# Patient Record
Sex: Female | Born: 2000 | Race: Black or African American | Hispanic: No | Marital: Single | Smoking: Never smoker
Health system: Southern US, Community
[De-identification: ages and names within clinical notes are randomized; demographics above are authoritative.]

## PROBLEM LIST (undated history)

## (undated) DIAGNOSIS — F909 Attention-deficit hyperactivity disorder, unspecified type: Secondary | ICD-10-CM

## (undated) DIAGNOSIS — R062 Wheezing: Secondary | ICD-10-CM

## (undated) DIAGNOSIS — F319 Bipolar disorder, unspecified: Secondary | ICD-10-CM

---
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
## 2021-03-25 IMAGING — DX RIGHT WRIST - COMPLETE 3+ VIEW
4 series · 4 of 4 positions shown · non-contrast
Comparison: Plain films right wrist 03/07/2018.

CLINICAL DATA: Right wrist pain and swelling for 1-1/2 months. No
known injury.

EXAM:
RIGHT WRIST - COMPLETE 3+ VIEW

[wrist pa]
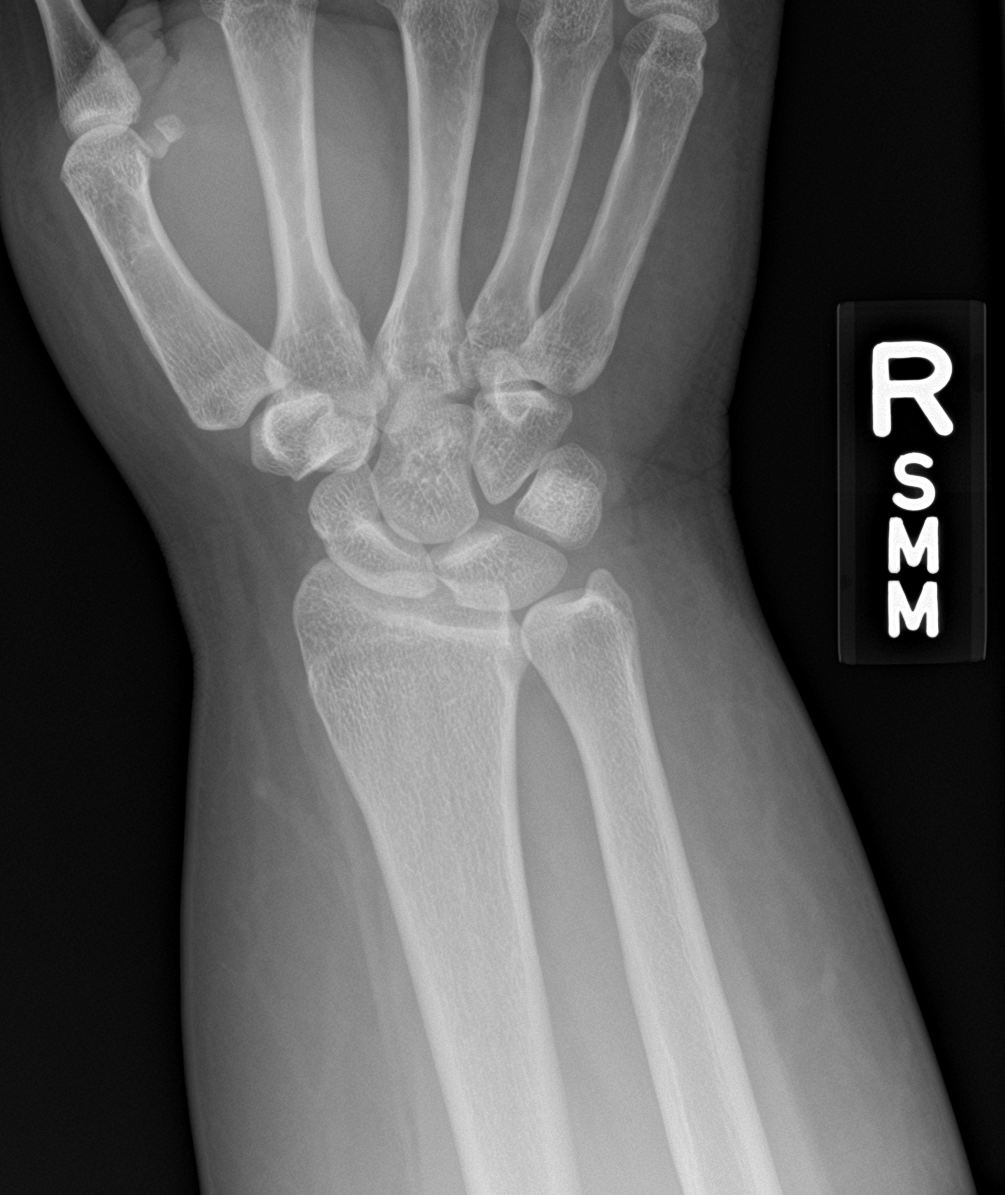

[wrist navicular]
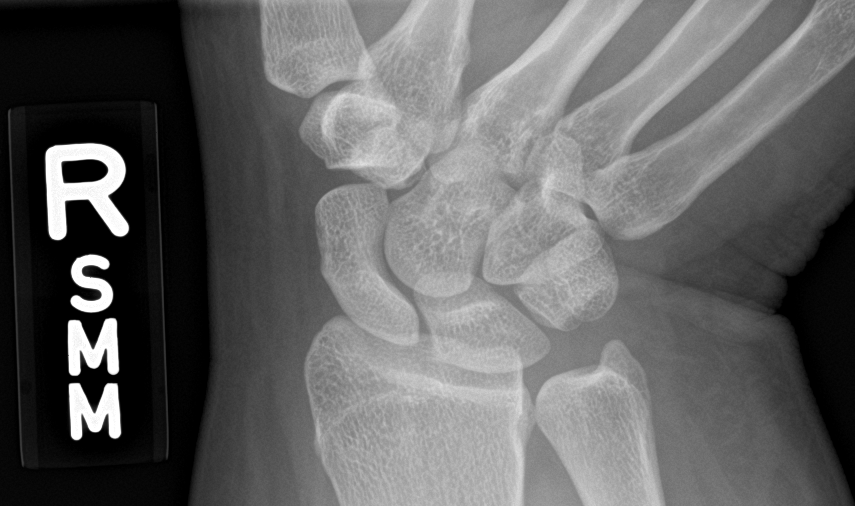

[wrist obl]
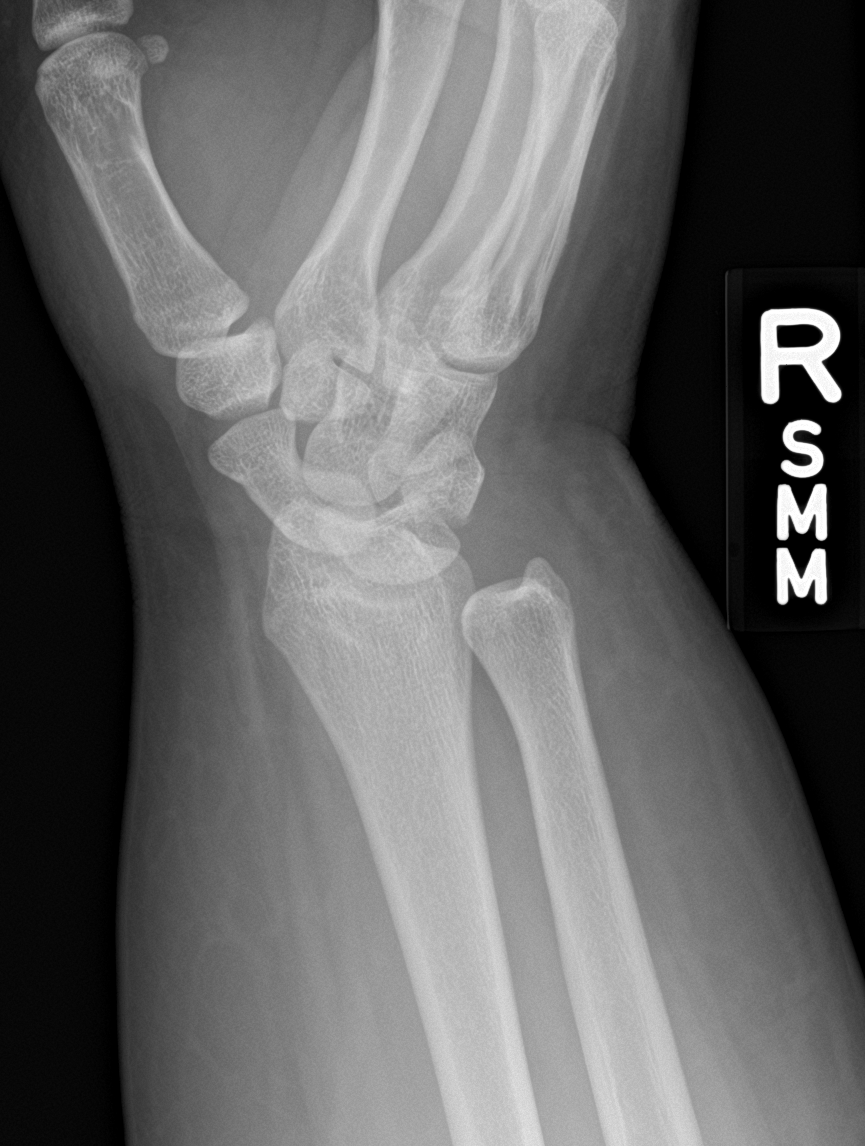

[wrist lat]
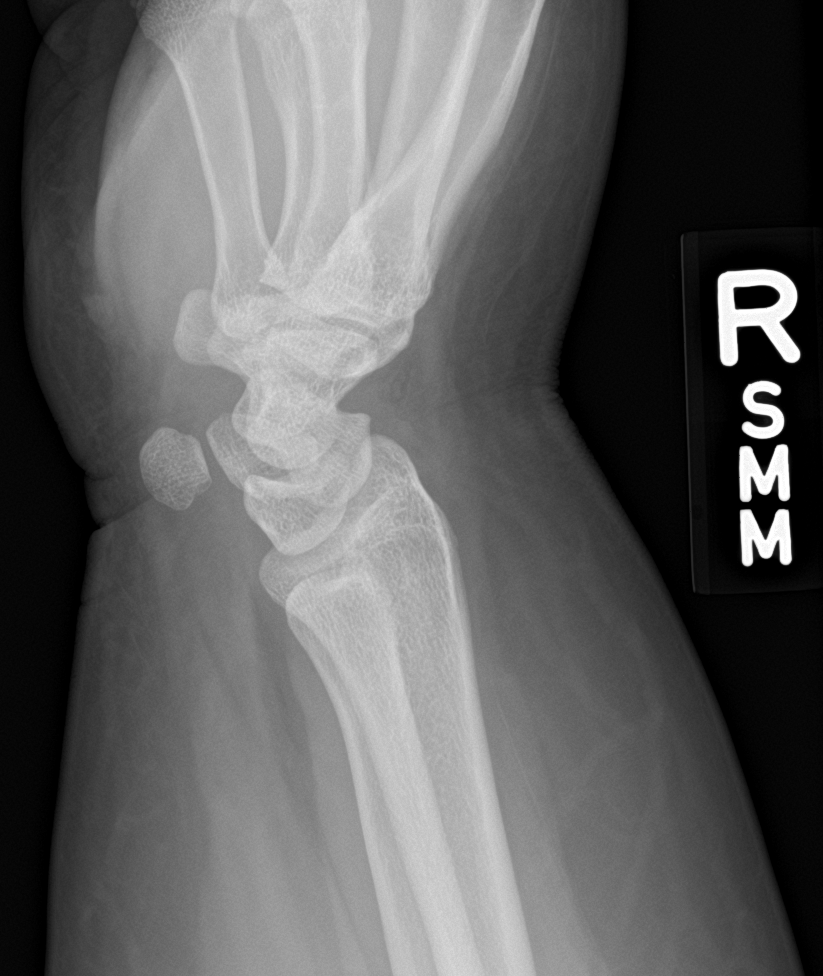

[4 of 4 positions shown; findings below may reference images not displayed]

FINDINGS: There is no evidence of fracture or dislocation. There is no
evidence of arthropathy or other focal bone abnormality. Soft
tissues are unremarkable.
IMPRESSION: Normal examination.

## 2023-01-26 ENCOUNTER — Telehealth: Payer: No Typology Code available for payment source | Admitting: Nurse Practitioner

## 2023-01-26 DIAGNOSIS — J40 Bronchitis, not specified as acute or chronic: Secondary | ICD-10-CM

## 2023-01-26 MED ORDER — ALBUTEROL SULFATE HFA 108 (90 BASE) MCG/ACT IN AERS: 2.0000 | INHALATION_SPRAY | Freq: Four times a day (QID) | RESPIRATORY_TRACT | 0 refills | Status: DC | PRN

## 2023-01-26 MED ORDER — DOXYCYCLINE HYCLATE 100 MG PO CAPS: 100.0000 mg | ORAL_CAPSULE | Freq: Two times a day (BID) | ORAL | 0 refills | Status: DC

## 2023-01-26 MED ORDER — PROMETHAZINE-DM 6.25-15 MG/5ML PO SYRP: 5.0000 mL | ORAL_SOLUTION | Freq: Four times a day (QID) | ORAL | 0 refills | Status: DC | PRN

## 2023-01-26 NOTE — Progress Notes (Signed)
 Virtual Visit Consent   [name], you are scheduled for a virtual visit with a [facility] provider today. Just as with appointments in the office, your consent must be obtained to participate. Your consent will be active for this visit and any virtual visit you may have with one of our providers in the next 365 days. If you have a MyChart account, a copy of this consent can be sent to you electronically.  As this is a virtual visit, video technology does not allow for your provider to perform a traditional examination. This may limit your provider's ability to fully assess your condition. If your provider identifies any concerns that need to be evaluated in person or the need to arrange testing (such as labs, EKG, etc.), we will make arrangements to do so. Although advances in technology are sophisticated, we cannot ensure that it will always work on either your end or our end. If the connection with a video visit is poor, the visit may have to be switched to a telephone visit. With either a video or telephone visit, we are not always able to ensure that we have a secure connection.  By engaging in this virtual visit, you consent to the provision of healthcare and authorize for your insurance to be billed (if applicable) for the services provided during this visit. Depending on your insurance coverage, you may receive a charge related to this service.  I need to obtain your verbal consent now. Are you willing to proceed with your visit today? Krystol Weng has provided verbal consent on 01/26/2023 for a virtual visit (video or telephone). Lauraine Kitty, FNP  Date: 01/26/2023 7:19 PM  Virtual Visit via Video Note   I, Lauraine Kitty, connected with  Rebecca Chang  ([id number], 07/13/2000) on 01/26/23 at  7:30 PM EST by a video-enabled telemedicine application and verified that I am speaking with the correct person using two identifiers.  Location: Patient: Virtual Visit Location Patient: Home Provider: Virtual  Visit Location Provider: Home Office   I discussed the limitations of evaluation and management by telemedicine and the availability of in person appointments. The patient expressed understanding and agreed to proceed.    History of Present Illness: Rebecca Chang is a 23 y.o. who identifies as a female who was assigned female at birth, and is being seen today for a cough that has been persistent since 01/19/23.  Started with sore throat and nasal congestion as well as the cough  The cough has persisted with productive mucous that is yellow Her cough has worsened to the point of giving her headaches  She does have a history of asthma though she does not currently have an inhaler  Believes she most recently used an inhaler 2+ years ago Typically only needs them now when she is sick   She has been using theraflu, elderberry and honey for relief    She did take a home COVID test and it was negative   Denies pregnancy   Problems:  Patient Active Problem List   Diagnosis Date Noted   Moderate persistent asthma 12/01/2014   Allergic rhinitis 12/01/2014    Allergies:  Allergies  Allergen Reactions   Lactose Intolerance (Gi)    Medications:  Current Outpatient Medications:    albuterol  (PROVENTIL  HFA;VENTOLIN  HFA) 108 (90 Base) MCG/ACT inhaler, Inhale 1-2 puffs into the lungs every 6 (six) hours as needed for wheezing or shortness of breath., Disp: 1 Inhaler, Rfl: 0   fluticasone  (FLONASE ) 50 MCG/ACT nasal spray,  Place 2 sprays into both nostrils daily., Disp: 1 g, Rfl: 0   guaiFENesin-codeine (ROBITUSSIN AC) 100-10 MG/5ML syrup, Take 5 mLs by mouth 3 (three) times daily as needed for cough., Disp: , Rfl:    ibuprofen  (ADVIL ) 800 MG tablet, Take 1 tablet (800 mg total) by mouth 3 (three) times daily., Disp: 42 tablet, Rfl: 0   ipratropium (ATROVENT ) 0.06 % nasal spray, Place 2 sprays into both nostrils 4 (four) times daily., Disp: 15 mL, Rfl: 0   lurasidone (LATUDA) 40 MG TABS tablet, Take  40 mg by mouth daily with breakfast., Disp: , Rfl:    predniSONE  (STERAPRED UNI-PAK 21 TAB) 10 MG (21) TBPK tablet, Take by mouth daily. Take 6 tabs by mouth daily  for 2 days, then 5 tabs for 2 days, then 4 tabs for 2 days, then 3 tabs for 2 days, 2 tabs for 2 days, then 1 tab by mouth daily for 2 days, Disp: 42 tablet, Rfl: 0  Observations/Objective: Patient is well-developed, well-nourished in no acute distress.  Resting comfortably  at home.  Head is normocephalic, atraumatic.  No labored breathing.  Speech is clear and coherent with logical content.  Patient is alert and oriented at baseline.    Assessment and Plan:  1. Bronchitis (Primary) Continue Zyrtec daily   - albuterol  (VENTOLIN  HFA) 108 (90 Base) MCG/ACT inhaler; Inhale 2 puffs into the lungs every 6 (six) hours as needed for wheezing or shortness of breath.  Dispense: 8 g; Refill: 0 - promethazine -dextromethorphan (PROMETHAZINE -DM) 6.25-15 MG/5ML syrup; Take 5 mLs by mouth 4 (four) times daily as needed for cough.  Dispense: 118 mL; Refill: 0 - doxycycline  (VIBRAMYCIN ) 100 MG capsule; Take 1 capsule (100 mg total) by mouth 2 (two) times daily for 7 days.  Dispense: 14 capsule; Refill: 0     Follow Up Instructions: I discussed the assessment and treatment plan with the patient. The patient was provided an opportunity to ask questions and all were answered. The patient agreed with the plan and demonstrated an understanding of the instructions.  A copy of instructions were sent to the patient via MyChart unless otherwise noted below.    The patient was advised to call back or seek an in-person evaluation if the symptoms worsen or if the condition fails to improve as anticipated.    Lauraine Kitty, FNP

## 2023-01-27 ENCOUNTER — Other Ambulatory Visit (INDEPENDENT_AMBULATORY_CARE_PROVIDER_SITE_OTHER): Payer: Self-pay | Admitting: Nurse Practitioner

## 2023-01-27 DIAGNOSIS — J40 Bronchitis, not specified as acute or chronic: Secondary | ICD-10-CM

## 2023-01-27 MED ORDER — PROMETHAZINE-DM 6.25-15 MG/5ML PO SYRP: 5.0000 mL | ORAL_SOLUTION | Freq: Four times a day (QID) | ORAL | 0 refills | Status: AC | PRN

## 2023-01-27 MED ORDER — DOXYCYCLINE HYCLATE 100 MG PO CAPS: 100.0000 mg | ORAL_CAPSULE | Freq: Two times a day (BID) | ORAL | 0 refills | Status: AC

## 2023-01-27 NOTE — Addendum Note (Signed)
 Addended by: Georgana Curio on: 01/27/2023 09:31 AM   Modules accepted: Orders

## 2023-02-18 ENCOUNTER — Other Ambulatory Visit (INDEPENDENT_AMBULATORY_CARE_PROVIDER_SITE_OTHER): Payer: Self-pay | Admitting: Nurse Practitioner

## 2023-02-18 DIAGNOSIS — J40 Bronchitis, not specified as acute or chronic: Secondary | ICD-10-CM

## 2023-05-28 ENCOUNTER — Telehealth: Payer: Self-pay | Admitting: Family Medicine

## 2023-05-28 DIAGNOSIS — R197 Diarrhea, unspecified: Secondary | ICD-10-CM

## 2023-05-28 MED ORDER — LOPERAMIDE HCL 2 MG PO TABS: 2.0000 mg | ORAL_TABLET | Freq: Four times a day (QID) | ORAL | 0 refills | Status: AC | PRN

## 2023-05-28 MED ORDER — HYDROCORTISONE 1 % EX CREA: 1.0000 | TOPICAL_CREAM | Freq: Two times a day (BID) | CUTANEOUS | 0 refills | Status: AC

## 2023-05-28 NOTE — Patient Instructions (Signed)
Most cases of acute diarrhea are due to infections with virus and bacteria and are self-limited conditions lasting less than 14 days.  For your symptoms you may take Imodium 2 mg tablets that are over the counter at your local pharmacy. Take two tablet now and then one after each loose stool up to 6 a day.  Antibiotics are not needed for most people with diarrhea.   HOME CARE We recommend changing your diet to help with your symptoms for the next few days. Drink plenty of fluids that contain water salt and sugar. Sports drinks such as Gatorade may help.  You may try broths, soups, bananas, applesauce, soft breads, mashed potatoes or crackers.  You are considered infectious for as long as the diarrhea continues. Hand washing or use of alcohol based hand sanitizers is recommend. It is best to stay out of work or school until your symptoms stop.   GET HELP RIGHT AWAY If you have dark yellow colored urine or do not pass urine frequently you should drink more fluids.   If your symptoms worsen  If you feel like you are going to pass out (faint) You have a new problem  MAKE SURE YOU  Understand these instructions. Will watch your condition. Will get help right away if you are not doing well or get worse.

## 2023-05-28 NOTE — Progress Notes (Signed)
 Virtual Visit Consent   Rebecca Chang, you are scheduled for a virtual visit with a [facility] provider today. Just as with appointments in the office, your consent must be obtained to participate. Your consent will be active for this visit and any virtual visit you may have with one of our providers in the next 365 days. If you have a MyChart account, a copy of this consent can be sent to you electronically.  As this is a virtual visit, video technology does not allow for your provider to perform a traditional examination. This may limit your provider's ability to fully assess your condition. If your provider identifies any concerns that need to be evaluated in person or the need to arrange testing (such as labs, EKG, etc.), we will make arrangements to do so. Although advances in technology are sophisticated, we cannot ensure that it will always work on either your end or our end. If the connection with a video visit is poor, the visit may have to be switched to a telephone visit. With either a video or telephone visit, we are not always able to ensure that we have a secure connection.  By engaging in this virtual visit, you consent to the provision of healthcare and authorize for your insurance to be billed (if applicable) for the services provided during this visit. Depending on your insurance coverage, you may receive a charge related to this service.  I need to obtain your verbal consent now. Are you willing to proceed with your visit today? Rebecca Chang has provided verbal consent on 05/28/2023 for a virtual visit (video or telephone). Lanetta Pion, NP  Date: 05/28/2023 11:05 AM   Virtual Visit via Video Note   I, Lanetta Pion, connected with  Rebecca Chang  ([id number], [birth year]) on 05/28/23 at 11:00 AM EDT by a video-enabled telemedicine application and verified that I am speaking with the correct person using two identifiers.  Location: Patient: Virtual Visit Location Patient: Home Provider:  Virtual Visit Location Provider: Home Office   I discussed the limitations of evaluation and management by telemedicine and the availability of in person appointments. The patient expressed understanding and agreed to proceed.    History of Present Illness: Rebecca Chang is a 23 y.o. who identifies as a female who was assigned female at birth, and is being seen today for diarrhea Reports having diarrhea since Friday morning. She tried pepto without much relief. Trying a bland diet and fluids as well. Nothing is helping much. Bottom is swollen and raw.  Denies blood in stool or urine, fevers, chills, nausea or vomiting, unsure if meal related, no new foods or meds.    Problems:  Patient Active Problem List   Diagnosis Date Noted   Moderate persistent asthma 12/01/2014   Allergic rhinitis 12/01/2014    Allergies:  Allergies  Allergen Reactions   Lactose Intolerance (Gi)    Medications:  Current Outpatient Medications:    albuterol  (PROVENTIL  HFA;VENTOLIN  HFA) 108 (90 Base) MCG/ACT inhaler, Inhale 1-2 puffs into the lungs every 6 (six) hours as needed for wheezing or shortness of breath., Disp: 1 Inhaler, Rfl: 0   albuterol  (VENTOLIN  HFA) 108 (90 Base) MCG/ACT inhaler, Inhale 2 puffs into the lungs every 6 (six) hours as needed for wheezing or shortness of breath., Disp: 8 g, Rfl: 0   fluticasone  (FLONASE ) 50 MCG/ACT nasal spray, Place 2 sprays into both nostrils daily., Disp: 1 g, Rfl: 0   guaiFENesin-codeine (ROBITUSSIN AC) 100-10 MG/5ML syrup, Take 5  mLs by mouth 3 (three) times daily as needed for cough., Disp: , Rfl:    ibuprofen  (ADVIL ) 800 MG tablet, Take 1 tablet (800 mg total) by mouth 3 (three) times daily., Disp: 42 tablet, Rfl: 0   ipratropium (ATROVENT ) 0.06 % nasal spray, Place 2 sprays into both nostrils 4 (four) times daily., Disp: 15 mL, Rfl: 0   lurasidone (LATUDA) 40 MG TABS tablet, Take 40 mg by mouth daily with breakfast., Disp: , Rfl:    predniSONE  (STERAPRED UNI-PAK 21  TAB) 10 MG (21) TBPK tablet, Take by mouth daily. Take 6 tabs by mouth daily  for 2 days, then 5 tabs for 2 days, then 4 tabs for 2 days, then 3 tabs for 2 days, 2 tabs for 2 days, then 1 tab by mouth daily for 2 days, Disp: 42 tablet, Rfl: 0  Observations/Objective: Patient is well-developed, well-nourished in no acute distress.  Resting comfortably at home.  Head is normocephalic, atraumatic.  No labored breathing.  Speech is clear and coherent with logical content.  Patient is alert and oriented at baseline.    Assessment and Plan:  1. Diarrhea, unspecified type (Primary)  - loperamide (IMODIUM A-D) 2 MG tablet; Take 1 tablet (2 mg total) by mouth 4 (four) times daily as needed for diarrhea or loose stools.  Dispense: 30 tablet; Refill: 0 - hydrocortisone cream 1 %; Apply 1 Application topically 2 (two) times daily.  Dispense: 90 g; Refill: 0   For your symptoms you may take Imodium 2 mg tablets that are over the counter at your local pharmacy. Take two tablet now and then one after each loose stool up to 6 a day.    HOME CARE We recommend changing your diet to help with your symptoms for the next few days. Drink plenty of fluids that contain water salt and sugar. Sports drinks such as Gatorade may help.  You may try broths, soups, bananas, applesauce, soft breads, mashed potatoes or crackers.  You are considered infectious for as long as the diarrhea continues. Hand washing or use of alcohol based hand sanitizers is recommend. It is best to stay out of work or school until your symptoms stop.    Reviewed side effects, risks and benefits of medication.    Patient acknowledged agreement and understanding of the plan.   Past Medical, Surgical, Social History, Allergies, and Medications have been Reviewed.    Follow Up Instructions: I discussed the assessment and treatment plan with the patient. The patient was provided an opportunity to ask questions and all were answered. The  patient agreed with the plan and demonstrated an understanding of the instructions.  A copy of instructions were sent to the patient via MyChart unless otherwise noted below.     The patient was advised to call back or seek an in-person evaluation if the symptoms worsen or if the condition fails to improve as anticipated.    Lanetta Pion, NP

## 2023-06-13 ENCOUNTER — Telehealth: Payer: Self-pay | Admitting: Physician Assistant

## 2023-06-13 DIAGNOSIS — B9689 Other specified bacterial agents as the cause of diseases classified elsewhere: Secondary | ICD-10-CM

## 2023-06-13 DIAGNOSIS — J019 Acute sinusitis, unspecified: Secondary | ICD-10-CM

## 2023-06-13 DIAGNOSIS — J4531 Mild persistent asthma with (acute) exacerbation: Secondary | ICD-10-CM

## 2023-06-13 MED ORDER — BENZONATATE 100 MG PO CAPS: 100.0000 mg | ORAL_CAPSULE | Freq: Three times a day (TID) | ORAL | 0 refills | Status: AC | PRN

## 2023-06-13 MED ORDER — PROMETHAZINE-DM 6.25-15 MG/5ML PO SYRP: 5.0000 mL | ORAL_SOLUTION | Freq: Four times a day (QID) | ORAL | 0 refills | Status: AC | PRN

## 2023-06-13 MED ORDER — AMOXICILLIN-POT CLAVULANATE 875-125 MG PO TABS: 1.0000 | ORAL_TABLET | Freq: Two times a day (BID) | ORAL | 0 refills | Status: AC

## 2023-06-13 MED ORDER — PREDNISONE 10 MG PO TABS: ORAL_TABLET | ORAL | 0 refills | Status: AC

## 2023-06-13 MED ORDER — ALBUTEROL SULFATE HFA 108 (90 BASE) MCG/ACT IN AERS: 2.0000 | INHALATION_SPRAY | Freq: Four times a day (QID) | RESPIRATORY_TRACT | 0 refills | Status: AC | PRN

## 2023-06-13 NOTE — Patient Instructions (Signed)
 [place], thank you for joining [name], PA-C for today's virtual visit.  While this provider is not your primary care provider (PCP), if your PCP is located in our provider database this encounter information will be shared with them immediately following your visit.   A [facility] MyChart account gives you access to today's visit and all your visits, tests, and labs performed at [facility] " click here if you don't have a [facility] MyChart account or go to [email or web address]  Consent: (Patient) Rebecca Chang provided verbal consent for this virtual visit at the beginning of the encounter.  Current Medications:  Current Outpatient Medications:    amoxicillin -clavulanate (AUGMENTIN) 875-125 MG tablet, Take 1 tablet by mouth 2 (two) times daily for 10 days., Disp: 20 tablet, Rfl: 0   benzonatate  (TESSALON ) 100 MG capsule, Take 1-2 capsules (100-200 mg total) by mouth 3 (three) times daily as needed., Disp: 30 capsule, Rfl: 0   predniSONE  (DELTASONE ) 10 MG tablet, Days 1-4 take 4 tablets (40 mg) daily  Days 5-8 take 3 tablets (30 mg) daily, Days 9-11 take 2 tablets (20 mg) daily, Days 12-14 take 1 tablet (10 mg) daily., Disp: 37 tablet, Rfl: 0   promethazine -dextromethorphan (PROMETHAZINE -DM) 6.25-15 MG/5ML syrup, Take 5 mLs by mouth 4 (four) times daily as needed., Disp: 118 mL, Rfl: 0   albuterol  (VENTOLIN  HFA) 108 (90 Base) MCG/ACT inhaler, Inhale 2 puffs into the lungs every 6 (six) hours as needed for wheezing or shortness of breath., Disp: 8 g, Rfl: 0   fluticasone  (FLONASE ) 50 MCG/ACT nasal spray, Place 2 sprays into both nostrils daily., Disp: 1 g, Rfl: 0   hydrocortisone  cream 1 %, Apply 1 Application topically 2 (two) times daily., Disp: 90 g, Rfl: 0   ibuprofen  (ADVIL ) 800 MG tablet, Take 1 tablet (800 mg total) by mouth 3 (three) times daily., Disp: 42 tablet, Rfl: 0   ipratropium (ATROVENT ) 0.06 % nasal spray, Place 2 sprays into both nostrils 4 (four)  times daily., Disp: 15 mL, Rfl: 0   loperamide  (IMODIUM  A-D) 2 MG tablet, Take 1 tablet (2 mg total) by mouth 4 (four) times daily as needed for diarrhea or loose stools., Disp: 30 tablet, Rfl: 0   lurasidone (LATUDA) 40 MG TABS tablet, Take 40 mg by mouth daily with breakfast., Disp: , Rfl:    Medications ordered in this encounter:  Meds ordered this encounter  Medications   amoxicillin -clavulanate (AUGMENTIN) 875-125 MG tablet    Sig: Take 1 tablet by mouth 2 (two) times daily for 10 days.    Dispense:  20 tablet    Refill:  0    Supervising Provider:   LAMPTEY, PHILIP O [[id number]]   predniSONE  (DELTASONE ) 10 MG tablet    Sig: Days 1-4 take 4 tablets (40 mg) daily  Days 5-8 take 3 tablets (30 mg) daily, Days 9-11 take 2 tablets (20 mg) daily, Days 12-14 take 1 tablet (10 mg) daily.    Dispense:  37 tablet    Refill:  0    Supervising Provider:   LAMPTEY, PHILIP O [[id number]]   promethazine -dextromethorphan (PROMETHAZINE -DM) 6.25-15 MG/5ML syrup    Sig: Take 5 mLs by mouth 4 (four) times daily as needed.    Dispense:  118 mL    Refill:  0    Supervising Provider:   Corine Dice [id number]   benzonatate  (TESSALON ) 100 MG capsule    Sig: Take 1-2 capsules (100-200 mg total) by mouth 3 (three) times daily  as needed.    Dispense:  30 capsule    Refill:  0    Supervising Provider:   LAMPTEY, PHILIP O [[id number]]   albuterol  (VENTOLIN  HFA) 108 (90 Base) MCG/ACT inhaler    Sig: Inhale 2 puffs into the lungs every 6 (six) hours as needed for wheezing or shortness of breath.    Dispense:  8 g    Refill:  0    Supervising Provider:   Corine Dice [[id number]]     *If you need refills on other medications prior to your next appointment, please contact your pharmacy*  Follow-Up: Call back or seek an in-person evaluation if the symptoms worsen or if the condition fails to improve as anticipated.  [facility] [phone]  Other Instructions  Bronchospasm,  Adult  Bronchospasm is a tightening of the smooth muscle that wraps around the small airways in the lungs. When the muscle tightens, the small airways narrow. Narrowed airways limit the air you breathe in or out of your lungs. Inflammation (swelling) and more mucus (sputum) than usual can further irritate the airways. This can make it very hard to breathe. Bronchospasm can happen suddenly or over a period of time. What are the causes? Common causes of this condition include: An infection, such as a cold or sinus drainage. Exercise. Strong odors from aerosol sprays, and fumes from perfume, candles, and household cleaners. Cold air. Stress or strong emotions such as crying or laughing. What increases the risk? The following factors may make you more likely to develop this condition: Having asthma. Smoking or being around someone who smokes (secondhand smoke). Seasonal allergies, such as pollen or mold. Allergic reaction (anaphylaxis) to food, medicine, or insect bites or stings. What are the signs or symptoms? Symptoms of this condition include: Making a high-pitched whistling sound when you breathe, most often when you breathe out (wheezing). Coughing. Chest tightness. Shortness of breath. Decreased ability to exercise. Noisy breathing or a high-pitched cough. How is this diagnosed? This condition may be diagnosed based on your medical history and a physical exam. Your health care provider may also perform tests, including: A chest X-ray. Lung function tests. How is this treated? This condition may be treated by: Using inhaled medicines. These open up (relax) the airways and help you breathe. They can be taken with a metered dose inhaler or a nebulizer device. Taking corticosteroid medicines. These may be given to reduce inflammation and swelling. Removing the irritant or trigger that started the bronchospasm. Follow these instructions at home: Medicines Take over-the-counter and  prescription medicines only as told by your health care provider. If you need to use an inhaler or nebulizer to take your medicine, ask your health care provider how to use it correctly. You may be given a spacer to use with your inhaler. This makes it easier to get the medicine from the inhaler into your lungs. Lifestyle Do not use any products that contain nicotine or tobacco. These products include cigarettes, chewing tobacco, and vaping devices, such as e-cigarettes. If you need help quitting, ask your health care provider. Keep track of things that trigger your bronchospasm. Avoid these if possible. When pollen, air pollution, or humidity levels are bad, keep windows closed and use an air conditioner or go to places that have air conditioning. Find ways to manage stress and your emotions, such as mindfulness, relaxation, or breathing exercises. Activity Some people have bronchospasm when they exercise. This is called exercise-induced bronchoconstriction (EIB). If you have this  problem, talk with your health care provider about how to manage EIB. Some tips include: Using your fast-acting inhaler before exercise. Exercising indoors if it is very cold or humid, or if the pollen and mold counts are high. Warming up and cool down before and after exercise. Stopping exercising right away if your symptoms start or get worse. General instructions If you have asthma, make sure you have an asthma action plan. Stay up to date on your immunizations. Keep all follow-up visits. This is important. Get help right away if: You have trouble breathing. Your wheezing and coughing do not get better after taking your medicine. You have chest pain. You have trouble speaking more than one-word sentences. These symptoms may be an emergency. Get help right away. Call 911. Do not wait to see if the symptoms will go away. Do not drive yourself to the hospital. Summary Bronchospasm is a tightening of the smooth  muscle that wraps around the small airways in the lungs. Some people have bronchospasm when they exercise. This is called exercise-induced bronchoconstriction (EIB). If you have this problem, talk with your health care provider about how to manage EIB. Do not use any products that contain nicotine or tobacco. These products include cigarettes, chewing tobacco, and vaping devices, such as e-cigarettes. If you need help quitting, ask your health care provider. Get help right away if your wheezing and coughing do not get better after taking your medicine. This information is not intended to replace advice given to you by your health care provider. Make sure you discuss any questions you have with your health care provider. Document Revised: 08/02/2020 Document Reviewed: 08/02/2020 Elsevier Patient Education  2024 [employer]  Sinus Infection, Adult A sinus infection, also called sinusitis, is inflammation of your sinuses. Sinuses are hollow spaces in the bones around your face. Your sinuses are located: Around your eyes. In the middle of your forehead. Behind your nose. In your cheekbones. Mucus normally drains out of your sinuses. When your nasal tissues become inflamed or swollen, mucus can become trapped or blocked. This allows bacteria, viruses, and fungi to grow, which leads to infection. Most infections of the sinuses are caused by a virus. A sinus infection can develop quickly. It can last for up to 4 weeks (acute) or for more than 12 weeks (chronic). A sinus infection often develops after a cold. What are the causes? This condition is caused by anything that creates swelling in the sinuses or stops mucus from draining. This includes: Allergies. Asthma. Infection from bacteria or viruses. Deformities or blockages in your nose or sinuses. Abnormal growths in the nose (nasal polyps). Pollutants, such as chemicals or irritants in the air. Infection from fungi. This is rare. What  increases the risk? You are more likely to develop this condition if you: Have a weak body defense system (immune system). Do a lot of swimming or diving. Overuse nasal sprays. Smoke. What are the signs or symptoms? The main symptoms of this condition are pain and a feeling of pressure around the affected sinuses. Other symptoms include: Stuffy nose or congestion that makes it difficult to breathe through your nose. Thick yellow or greenish drainage from your nose. Tenderness, swelling, and warmth over the affected sinuses. A cough that may get worse at night. Decreased sense of smell and taste. Extra mucus that collects in the throat or the back of the nose (postnasal drip) causing a sore throat or bad breath. Tiredness (fatigue). Fever. How is this diagnosed? This condition  is diagnosed based on: Your symptoms. Your medical history. A physical exam. Tests to find out if your condition is acute or chronic. This may include: Checking your nose for nasal polyps. Viewing your sinuses using a device that has a light (endoscope). Testing for allergies or bacteria. Imaging tests, such as an MRI or CT scan. In rare cases, a bone biopsy may be done to rule out more serious types of fungal sinus disease. How is this treated? Treatment for a sinus infection depends on the cause and whether your condition is chronic or acute. If caused by a virus, your symptoms should go away on their own within 10 days. You may be given medicines to relieve symptoms. They include: Medicines that shrink swollen nasal passages (decongestants). A spray that eases inflammation of the nostrils (topical intranasal corticosteroids). Rinses that help get rid of thick mucus in your nose (nasal saline washes). Medicines that treat allergies (antihistamines). Over-the-counter pain relievers. If caused by bacteria, your health care provider may recommend waiting to see if your symptoms improve. Most bacterial  infections will get better without antibiotic medicine. You may be given antibiotics if you have: A severe infection. A weak immune system. If caused by narrow nasal passages or nasal polyps, surgery may be needed. Follow these instructions at home: Medicines Take, use, or apply over-the-counter and prescription medicines only as told by your health care provider. These may include nasal sprays. If you were prescribed an antibiotic medicine, take it as told by your health care provider. Do not stop taking the antibiotic even if you start to feel better. Hydrate and humidify  Drink enough fluid to keep your urine pale yellow. Staying hydrated will help to thin your mucus. Use a cool mist humidifier to keep the humidity level in your home above 50%. Inhale steam for 10-15 minutes, 3-4 times a day, or as told by your health care provider. You can do this in the bathroom while a hot shower is running. Limit your exposure to cool or dry air. Rest Rest as much as possible. Sleep with your head raised (elevated). Make sure you get enough sleep each night. General instructions  Apply a warm, moist washcloth to your face 3-4 times a day or as told by your health care provider. This will help with discomfort. Use nasal saline washes as often as told by your health care provider. Wash your hands often with soap and water to reduce your exposure to germs. If soap and water are not available, use hand sanitizer. Do not smoke. Avoid being around people who are smoking (secondhand smoke). Keep all follow-up visits. This is important. Contact a health care provider if: You have a fever. Your symptoms get worse. Your symptoms do not improve within 10 days. Get help right away if: You have a severe headache. You have persistent vomiting. You have severe pain or swelling around your face or eyes. You have vision problems. You develop confusion. Your neck is stiff. You have trouble breathing. These  symptoms may be an emergency. Get help right away. Call 911. Do not wait to see if the symptoms will go away. Do not drive yourself to the hospital. Summary A sinus infection is soreness and inflammation of your sinuses. Sinuses are hollow spaces in the bones around your face. This condition is caused by nasal tissues that become inflamed or swollen. The swelling traps or blocks the flow of mucus. This allows bacteria, viruses, and fungi to grow, which leads to infection. If  you were prescribed an antibiotic medicine, take it as told by your health care provider. Do not stop taking the antibiotic even if you start to feel better. Keep all follow-up visits. This is important. This information is not intended to replace advice given to you by your health care provider. Make sure you discuss any questions you have with your health care provider. Document Revised: 12/14/2020 Document Reviewed: 12/14/2020 Elsevier Patient Education  2024 [employer] Inc.   If you have been instructed to have an in-person evaluation today at a local Urgent Care facility, please use the link below. It will take you to a list of all of our available [facility], including address, phone number and hours of operation. Please do not delay care.  [facility]  If you or a family member do not have a primary care provider, use the link below to schedule a visit and establish care. When you choose a [facility] primary care physician or advanced practice provider, you gain a long-term partner in health. Find a Primary Care Provider  Learn more about [facility]'s in-office and virtual care options: [facility] - Get Care Now

## 2023-06-13 NOTE — Progress Notes (Signed)
 Virtual Visit Consent   [name], you are scheduled for a virtual visit with a [facility] provider today. Just as with appointments in the office, your consent must be obtained to participate. Your consent will be active for this visit and any virtual visit you may have with one of our providers in the next 365 days. If you have a MyChart account, a copy of this consent can be sent to you electronically.  As this is a virtual visit, video technology does not allow for your provider to perform a traditional examination. This may limit your provider's ability to fully assess your condition. If your provider identifies any concerns that need to be evaluated in person or the need to arrange testing (such as labs, EKG, etc.), we will make arrangements to do so. Although advances in technology are sophisticated, we cannot ensure that it will always work on either your end or our end. If the connection with a video visit is poor, the visit may have to be switched to a telephone visit. With either a video or telephone visit, we are not always able to ensure that we have a secure connection.  By engaging in this virtual visit, you consent to the provision of healthcare and authorize for your insurance to be billed (if applicable) for the services provided during this visit. Depending on your insurance coverage, you may receive a charge related to this service.  I need to obtain your verbal consent now. Are you willing to proceed with your visit today? [name] has provided verbal consent on 06/13/2023 for a virtual visit (video or telephone). [name], PA-C  Date: 06/13/2023 12:08 PM   Virtual Visit via Video Note   I, [name], connected with  Rebecca Chang  ([id number], 2000-05-14) on 06/13/23 at 11:45 AM EDT by a video-enabled telemedicine application and verified that I am speaking with the correct person using two identifiers.  Location: Patient: Virtual Visit Location Patient:  Home Provider: Virtual Visit Location Provider: Home Office   I discussed the limitations of evaluation and management by telemedicine and the availability of in person appointments. The patient expressed understanding and agreed to proceed.    History of Present Illness: Rebecca Chang is a 23 y.o. who identifies as a female who was assigned female at birth, and is being seen today for URI/cough.  HPI: URI  This is a new problem. The current episode started in the past 7 days. The problem has been gradually worsening. There has been no fever. Associated symptoms include congestion, coughing (worse at night, productive, discolored), headaches, a plugged ear sensation, rhinorrhea, sinus pain, a sore throat (mild, from drainage and cough) and wheezing (not worse than baseline and improves with inhaler). Pertinent negatives include no chest pain, diarrhea, ear pain, nausea or vomiting. Associated symptoms comments: Hot and cold flashes, fatigue. She has tried inhaler use (generic cold and flu, theraflu powder, fluticasone ) for the symptoms. The treatment provided no relief.     Problems:  Patient Active Problem List   Diagnosis Date Noted   Moderate persistent asthma 12/01/2014   Allergic rhinitis 12/01/2014    Allergies:  Allergies  Allergen Reactions   Lactose Intolerance (Gi)    Medications:  Current Outpatient Medications:    amoxicillin -clavulanate (AUGMENTIN) 875-125 MG tablet, Take 1 tablet by mouth 2 (two) times daily for 10 days., Disp: 20 tablet, Rfl: 0   benzonatate  (TESSALON ) 100 MG capsule, Take 1-2 capsules (100-200 mg total) by mouth 3 (three)  times daily as needed., Disp: 30 capsule, Rfl: 0   predniSONE  (DELTASONE ) 10 MG tablet, Days 1-4 take 4 tablets (40 mg) daily  Days 5-8 take 3 tablets (30 mg) daily, Days 9-11 take 2 tablets (20 mg) daily, Days 12-14 take 1 tablet (10 mg) daily., Disp: 37 tablet, Rfl: 0   promethazine -dextromethorphan (PROMETHAZINE -DM) 6.25-15 MG/5ML syrup,  Take 5 mLs by mouth 4 (four) times daily as needed., Disp: 118 mL, Rfl: 0   albuterol  (VENTOLIN  HFA) 108 (90 Base) MCG/ACT inhaler, Inhale 2 puffs into the lungs every 6 (six) hours as needed for wheezing or shortness of breath., Disp: 8 g, Rfl: 0   fluticasone  (FLONASE ) 50 MCG/ACT nasal spray, Place 2 sprays into both nostrils daily., Disp: 1 g, Rfl: 0   hydrocortisone  cream 1 %, Apply 1 Application topically 2 (two) times daily., Disp: 90 g, Rfl: 0   ibuprofen  (ADVIL ) 800 MG tablet, Take 1 tablet (800 mg total) by mouth 3 (three) times daily., Disp: 42 tablet, Rfl: 0   ipratropium (ATROVENT ) 0.06 % nasal spray, Place 2 sprays into both nostrils 4 (four) times daily., Disp: 15 mL, Rfl: 0   loperamide  (IMODIUM  A-D) 2 MG tablet, Take 1 tablet (2 mg total) by mouth 4 (four) times daily as needed for diarrhea or loose stools., Disp: 30 tablet, Rfl: 0   lurasidone (LATUDA) 40 MG TABS tablet, Take 40 mg by mouth daily with breakfast., Disp: , Rfl:   Observations/Objective: Patient is well-developed, well-nourished in no acute distress.  Resting comfortably at home.  Head is normocephalic, atraumatic.  No labored breathing.  Speech is clear and coherent with logical content.  Patient is alert and oriented at baseline.    Assessment and Plan: 1. Acute bacterial sinusitis (Primary) - amoxicillin -clavulanate (AUGMENTIN) 875-125 MG tablet; Take 1 tablet by mouth 2 (two) times daily for 10 days.  Dispense: 20 tablet; Refill: 0 - predniSONE  (DELTASONE ) 10 MG tablet; Days 1-4 take 4 tablets (40 mg) daily  Days 5-8 take 3 tablets (30 mg) daily, Days 9-11 take 2 tablets (20 mg) daily, Days 12-14 take 1 tablet (10 mg) daily.  Dispense: 37 tablet; Refill: 0 - promethazine -dextromethorphan (PROMETHAZINE -DM) 6.25-15 MG/5ML syrup; Take 5 mLs by mouth 4 (four) times daily as needed.  Dispense: 118 mL; Refill: 0 - benzonatate  (TESSALON ) 100 MG capsule; Take 1-2 capsules (100-200 mg total) by mouth 3 (three) times  daily as needed.  Dispense: 30 capsule; Refill: 0  2. Mild persistent asthma with acute exacerbation - amoxicillin -clavulanate (AUGMENTIN) 875-125 MG tablet; Take 1 tablet by mouth 2 (two) times daily for 10 days.  Dispense: 20 tablet; Refill: 0 - predniSONE  (DELTASONE ) 10 MG tablet; Days 1-4 take 4 tablets (40 mg) daily  Days 5-8 take 3 tablets (30 mg) daily, Days 9-11 take 2 tablets (20 mg) daily, Days 12-14 take 1 tablet (10 mg) daily.  Dispense: 37 tablet; Refill: 0 - promethazine -dextromethorphan (PROMETHAZINE -DM) 6.25-15 MG/5ML syrup; Take 5 mLs by mouth 4 (four) times daily as needed.  Dispense: 118 mL; Refill: 0 - benzonatate  (TESSALON ) 100 MG capsule; Take 1-2 capsules (100-200 mg total) by mouth 3 (three) times daily as needed.  Dispense: 30 capsule; Refill: 0 - albuterol  (VENTOLIN  HFA) 108 (90 Base) MCG/ACT inhaler; Inhale 2 puffs into the lungs every 6 (six) hours as needed for wheezing or shortness of breath.  Dispense: 8 g; Refill: 0  - Worsening over a week despite OTC medications - Will treat with Augmentin, Promethazine  DM and tessalon  perles - Prednisone   taper prescribed and albuterol  refilled for asthma exacerbation - Can continue Mucinex (PLAIN) - Push fluids.  - Rest.  - Steam and humidifier can help - Seek in person evaluation if worsening or symptoms fail to improve    Follow Up Instructions: I discussed the assessment and treatment plan with the patient. The patient was provided an opportunity to ask questions and all were answered. The patient agreed with the plan and demonstrated an understanding of the instructions.  A copy of instructions were sent to the patient via MyChart unless otherwise noted below.    The patient was advised to call back or seek an in-person evaluation if the symptoms worsen or if the condition fails to improve as anticipated.    [name], PA-C
